# Patient Record
Sex: Male | Born: 1975 | Race: Black or African American | Hispanic: No | Smoking: Current some day smoker
Health system: Southern US, Community
[De-identification: ages and names within clinical notes are randomized; demographics above are authoritative.]

## PROBLEM LIST (undated history)

## (undated) DIAGNOSIS — Z789 Other specified health status: Secondary | ICD-10-CM

## (undated) HISTORY — DX: Other specified health status: Z78.9

## (undated) HISTORY — PX: OTHER SURGICAL HISTORY: SHX169

---

## 2005-11-05 ENCOUNTER — Emergency Department: Payer: Self-pay | Admitting: Emergency Medicine

## 2016-11-14 ENCOUNTER — Emergency Department
Admission: EM | Admit: 2016-11-14 | Discharge: 2016-11-14 | Disposition: A | Discharge status: Home / Self Care | Payer: Self-pay | Attending: Emergency Medicine | Admitting: Emergency Medicine

## 2016-11-14 ENCOUNTER — Encounter: Payer: Self-pay | Admitting: Emergency Medicine

## 2016-11-14 ENCOUNTER — Emergency Department: Payer: Self-pay

## 2016-11-14 DIAGNOSIS — R361 Hematospermia: Secondary | ICD-10-CM

## 2016-11-14 DIAGNOSIS — N3001 Acute cystitis with hematuria: Secondary | ICD-10-CM

## 2016-11-14 DIAGNOSIS — N5089 Other specified disorders of the male genital organs: Secondary | ICD-10-CM

## 2016-11-14 DIAGNOSIS — N50812 Left testicular pain: Secondary | ICD-10-CM

## 2016-11-14 DIAGNOSIS — F17200 Nicotine dependence, unspecified, uncomplicated: Secondary | ICD-10-CM | POA: Insufficient documentation

## 2016-11-14 DIAGNOSIS — N503 Cyst of epididymis: Secondary | ICD-10-CM

## 2016-11-14 LAB — URINALYSIS, COMPLETE (UACMP) WITH MICROSCOPIC
BILIRUBIN URINE: NEGATIVE
Glucose, UA: NEGATIVE mg/dL
KETONES UR: 20 mg/dL — AB
Nitrite: NEGATIVE
PROTEIN: 30 mg/dL — AB
SQUAMOUS EPITHELIAL / LPF: NONE SEEN
Specific Gravity, Urine: 1.023 (ref 1.005–1.030)
pH: 6 (ref 5.0–8.0)

## 2016-11-14 MED ORDER — ONDANSETRON 4 MG PO TBDP
4.0000 mg | ORAL_TABLET | Freq: Once | ORAL | Status: AC
  Administered 2016-11-14: 4 mg via ORAL
  Filled 2016-11-14: qty 1

## 2016-11-14 MED ORDER — ACETAMINOPHEN 325 MG PO TABS
650.0000 mg | ORAL_TABLET | Freq: Once | ORAL | Status: AC
  Administered 2016-11-14: 650 mg via ORAL
  Filled 2016-11-14: qty 2

## 2016-11-14 MED ORDER — SULFAMETHOXAZOLE-TRIMETHOPRIM 800-160 MG PO TABS: 1.0000 | ORAL_TABLET | Freq: Two times a day (BID) | ORAL | 0 refills | Status: DC

## 2016-11-14 MED ORDER — IBUPROFEN 800 MG PO TABS: 800.0000 mg | ORAL_TABLET | Freq: Three times a day (TID) | ORAL | 0 refills | Status: DC | PRN

## 2016-11-14 MED ORDER — IBUPROFEN 800 MG PO TABS
800.0000 mg | ORAL_TABLET | Freq: Once | ORAL | Status: AC
  Administered 2016-11-14: 800 mg via ORAL
  Filled 2016-11-14: qty 1

## 2016-11-14 NOTE — ED Notes (Signed)
AAOx3.  Skin warm and dry.  NAD 

## 2016-11-14 NOTE — ED Notes (Signed)
Pt unable to sit still in triage due to pain
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
## 2016-11-14 NOTE — Discharge Instructions (Addendum)
You may apply ice to your testicle for 15 minutes every 2 hours to decrease pain. You may also take Motrin or Tylenol.  If you continue to have discomfort, you may see the outpatient urologist, Dr. Apolinar JunesBrandon.  Today you have an infection in your urine; please take the entire course of antibiotics and have your doctor follow-up your urine culture.  Return to the emergency department if you develop severe pain, swelling, redness, fever, or any other symptoms concerning to you.
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
## 2016-11-14 NOTE — ED Notes (Signed)
Patient transported to Ultrasound 

## 2016-11-17 LAB — URINE CULTURE: Culture: >=100000 — AB

## 2018-07-16 ENCOUNTER — Other Ambulatory Visit: Payer: Self-pay

## 2018-07-16 ENCOUNTER — Encounter: Payer: Self-pay | Admitting: Urology

## 2018-07-16 ENCOUNTER — Ambulatory Visit (INDEPENDENT_AMBULATORY_CARE_PROVIDER_SITE_OTHER): Payer: No Typology Code available for payment source | Admitting: Urology

## 2018-07-16 VITALS — BP 152/81 | HR 96 | Ht 72.0 in | Wt 234.0 lb

## 2018-07-16 DIAGNOSIS — N50819 Testicular pain, unspecified: Secondary | ICD-10-CM

## 2018-07-16 DIAGNOSIS — N390 Urinary tract infection, site not specified: Secondary | ICD-10-CM | POA: Diagnosis not present

## 2018-07-16 DIAGNOSIS — N453 Epididymo-orchitis: Secondary | ICD-10-CM

## 2018-07-16 LAB — BLADDER SCAN AMB NON-IMAGING

## 2018-07-16 NOTE — Progress Notes (Signed)
07/16/2018 8:27 PM   Jason Yang Jun 07, 1975 [id number]  Referring provider: Cyndie Chime, MD [address] [place],  [place] [address]  Chief Complaint  Patient presents with  . Epididymo-orchitis    New Patient    HPI: 43 year old male who presents today to establish care for history of epididymitis.  He was seen and evaluated by his primary care physician on 06/09/2018 with onset of right testicular pain and swelling.  He was treated with antibiotics, Levaquin for 10 days.  No UA or labs were performed.  No imaging.  He was seen a few days later in follow-up and was improving.  He was referred to urology for further evaluation.  He does have a personal history of epididymitis in 2018 as well.  On this occasion, his pain is on the left side.  His UA at the time was suspicious for UTI and his urine culture ultimately grew E. coli.  He responded well to Bactrim.  He is sexually active with women.  He does not always use condoms.  He denies any urethral discharge.  He is not aware of any STI exposures.  In general when he is not symptomatic with epididymoorchitis, he does feel like he empties his bladder fairly well.  He occasionally has some post void dribbling, otherwise no urgency or frequency, dysuria or gross hematuria.  He denies any voiding symptoms today.  He wants to know why he keeps getting these testicular infections.  He does note that he often notices changes in his urination including color as well as odor when he is been drinking too much the hall.   PMH: Past Medical History:  Diagnosis Date  . Known health problems: none     Surgical History: Past Surgical History:  Procedure Laterality Date  . none      Home Medications:  Allergies as of 07/16/2018   No Known Allergies     Medication List       Accurate as of July 16, 2018 11:59 PM. If you have any questions, ask your nurse or doctor.        STOP taking these medications    ibuprofen 800 MG tablet Commonly known as: ADVIL Stopped by: Hollice Espy, MD   sulfamethoxazole-trimethoprim 800-160 MG tablet Commonly known as: BACTRIM DS Stopped by: Hollice Espy, MD       Allergies: No Known Allergies  Family History: Family History  Problem Relation Age of Onset  . Bladder Cancer Neg Hx   . Prostate cancer Neg Hx   . Kidney cancer Neg Hx     Social History:  reports that he has been smoking. He has never used smokeless tobacco. He reports current alcohol use. No history on file for drug.  ROS: UROLOGY Frequent Urination?: No Hard to postpone urination?: No Burning/pain with urination?: No Get up at night to urinate?: No Leakage of urine?: No Urine stream starts and stops?: No Trouble starting stream?: No Do you have to strain to urinate?: No Blood in urine?: No Urinary tract infection?: No Sexually transmitted disease?: No Injury to kidneys or bladder?: No Painful intercourse?: No Weak stream?: No Erection problems?: No Penile pain?: No  Gastrointestinal Nausea?: No Vomiting?: No Indigestion/heartburn?: No Diarrhea?: No Constipation?: No  Constitutional Fever: No Night sweats?: No Weight loss?: No Fatigue?: No  Skin Skin rash/lesions?: No Itching?: No  Eyes Blurred vision?: No Double vision?: No  Ears/Nose/Throat Sore throat?: No Sinus problems?: No  Hematologic/Lymphatic Swollen glands?: No Easy bruising?: No  Cardiovascular Leg swelling?: No Chest pain?: No  Respiratory Cough?: No Shortness of breath?: No  Endocrine Excessive thirst?: No  Musculoskeletal Back pain?: No Joint pain?: No  Neurological Headaches?: No Dizziness?: No  Psychologic Depression?: No Anxiety?: No  Physical Exam: BP (!) 152/81   Pulse 96   Ht 6' (1.829 m)   Wt 234 lb (106.1 kg)   BMI 31.74 kg/m   Constitutional:  Alert and oriented, No acute distress. HEENT: [place] AT, moist mucus membranes.  Trachea midline, no  masses. Cardiovascular: No clubbing, cyanosis, or edema. Respiratory: Normal respiratory effort, no increased work of breathing. GI: Abdomen is soft, nontender, nondistended, no abdominal masses GU: No CVA tenderness.  Urged, minimally tender right testicle without a discrete mass but grossly abnormal.  No scrotal skin changes.  Left testicle is normal, nontender, no no masses.  Circumcised phallus with no urethral discharge, orthotopic meatus. Lymph: Noinguinal lymphadenopathy. Skin: No rashes, bruises or suspicious lesions. Neurologic: Grossly intact, no focal deficits, moving all 4 extremities. Psychiatric: Normal mood and affect.  Laboratory Data: NA  Urinalysis Results for orders placed or performed in visit on 07/16/18  Urine culture   Specimen: Urine   UR  Result Value Ref Range   Urine Culture, Routine Final report (A)    Organism ID, Bacteria Comment (A)    Antimicrobial Susceptibility Comment   GC/Chlamydia Probe Amp([facility])   Specimen: Urine   UR  Result Value Ref Range   Chlamydia trachomatis, NAA Negative Negative   Neisseria Gonorrhoeae by PCR Negative Negative  Microscopic Examination   URINE  Result Value Ref Range   WBC, UA 11-30 (A) 0 - 5 /hpf   RBC None seen 0 - 2 /hpf   Epithelial Cells (non renal) None seen 0 - 10 /hpf   Bacteria, UA Moderate (A) None seen/Few  Urinalysis, Complete  Result Value Ref Range   Specific Gravity, UA 1.015 1.005 - 1.030   pH, UA 6.5 5.0 - 7.5   Color, UA Yellow Yellow   Appearance Ur Clear Clear   Leukocytes,UA 2+ (A) Negative   Protein,UA Negative Negative/Trace   Glucose, UA Negative Negative   Ketones, UA Negative Negative   RBC, UA Negative Negative   Bilirubin, UA Negative Negative   Urobilinogen, Ur 1.0 0.2 - 1.0 mg/dL   Nitrite, UA Negative Negative   Microscopic Examination See below:   Bladder Scan (Post Void Residual) in office  Result Value Ref Range   Scan Result 45ml      Pertinent Imaging:    Assessment & Plan:    1. Testicle pain Likely secondary to recurrent episode of epididymoorchitis, treated in the emergency room with a round of antibiotics with some improvement but without complete resolution.  UA today is highly suspicious for ongoing infection.  We will send off UA/urine culture and go ahead and treat with Bactrim DS twice daily for presumed UTI.  We also discussed that given his age group and history of unprotected intercourse, would strongly recommend ruling out sexually transmitted infections which she is agreeable today.  We will send off chlamydia and gonorrhea.  We discussed today that it is fairly unusual to have recurrent episodes of urinary tract infections in a male.  We discussed that if he continues to have these infections, would strongly recommend additional upper tract imaging.  He is agreeable this plan.  Adequate bladder emptying.  - Urinalysis, Complete - Urine culture - US SCROTUM; Future - US PELVIC DOPPLER (TORSION R/O OR MASS  ARTERIAL FLOW); Future - GC/Chlamydia Probe Amp([facility]) - Bladder Scan (Post Void Residual) in office  2. Orchitis and epididymitis , Right testicular exam, presumably related to infection but cannot rule out underlying mass.  Have recommended follow-up scrotal ultrasound which he is agreeable.  I will call him with these results.  3. Recurrent UTI As above   Vanna ScotlandAshley Suleyman Ehrman, MD  [facility] [address], [address] [place], [place] [address] [phone]

## 2018-07-17 LAB — URINALYSIS, COMPLETE
Bilirubin, UA: NEGATIVE
Glucose, UA: NEGATIVE
Ketones, UA: NEGATIVE
Nitrite, UA: NEGATIVE
Protein,UA: NEGATIVE
RBC, UA: NEGATIVE
Specific Gravity, UA: 1.015 (ref 1.005–1.030)
Urobilinogen, Ur: 1 mg/dL (ref 0.2–1.0)
pH, UA: 6.5 (ref 5.0–7.5)

## 2018-07-17 LAB — MICROSCOPIC EXAMINATION
Epithelial Cells (non renal): NONE SEEN /hpf (ref 0–10)
RBC: NONE SEEN /hpf (ref 0–2)

## 2018-07-19 LAB — URINE CULTURE

## 2018-07-19 LAB — GC/CHLAMYDIA PROBE AMP
Chlamydia trachomatis, NAA: NEGATIVE
Neisseria Gonorrhoeae by PCR: NEGATIVE

## 2018-07-22 ENCOUNTER — Telehealth: Payer: Self-pay | Admitting: *Deleted

## 2018-07-22 ENCOUNTER — Telehealth: Payer: Self-pay | Admitting: Urology

## 2018-07-22 NOTE — Telephone Encounter (Signed)
Pt returned missed call, please call him back at [phone].

## 2018-07-22 NOTE — Telephone Encounter (Signed)
Spoke with patient see telephone encounter.

## 2018-07-22 NOTE — Telephone Encounter (Addendum)
Patient informed-verbalized understanding.  He will call to cancel appointment for ultrasound. Follow up appointment scheduled in one month.   ----- Message from Hollice Espy, MD sent at 07/22/2018  8:10 AM EDT ----- Chlamydia and gonorrhea are negative.  Urine culture ended up growing E. coli which is sensitive to the antibiotic, Bactrim which was prescribed.  Given that you continue to have an active infection, I think we can hold off on the scrotal ultrasound at this time.  If the swelling does not resolve, we can always get the ultrasound later on.  I like to see you again in a month to reassess with repeat exam.  Please schedule.  Hollice Espy, MD

## 2018-07-25 ENCOUNTER — Ambulatory Visit: Admission: RE | Admit: 2018-07-25 | Payer: No Typology Code available for payment source | Source: Ambulatory Visit

## 2018-08-06 ENCOUNTER — Ambulatory Visit: Payer: Self-pay | Admitting: Urology

## 2018-08-20 ENCOUNTER — Ambulatory Visit: Payer: Self-pay | Admitting: Urology

## 2018-10-04 IMAGING — US US SCROTUM W/ DOPPLER COMPLETE
1 series · 13 of 25 positions shown · non-contrast
Comparison: None.

CLINICAL DATA: Left-sided scrotal pain and swelling for 2 days.
Recent trauma.

EXAM:
SCROTAL ULTRASOUND
DOPPLER ULTRASOUND OF THE TESTICLES
TECHNIQUE: Complete ultrasound examination of the testicles, epididymis, and
other scrotal structures was performed. Color and spectral Doppler
ultrasound were also utilized to evaluate blood flow to the
testicles.

[Series 1: us scrotum w/ doppler complete · 0.08mm/px · 13 of 101 slices shown]
[im 1/101]
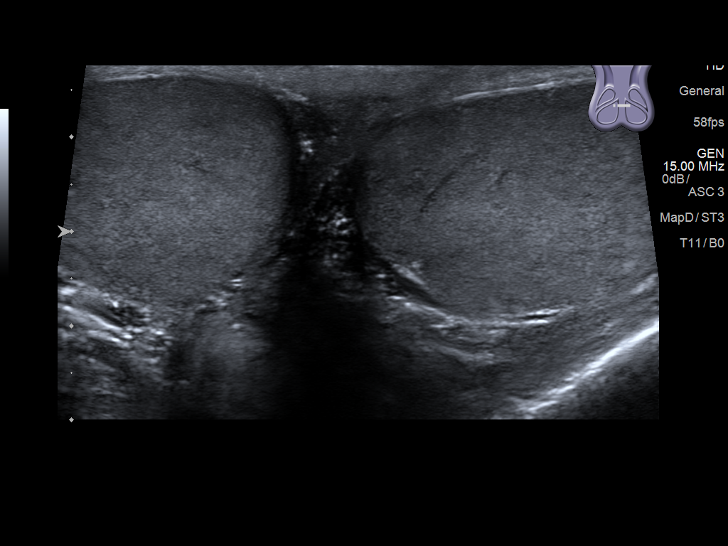
[im 9/101]
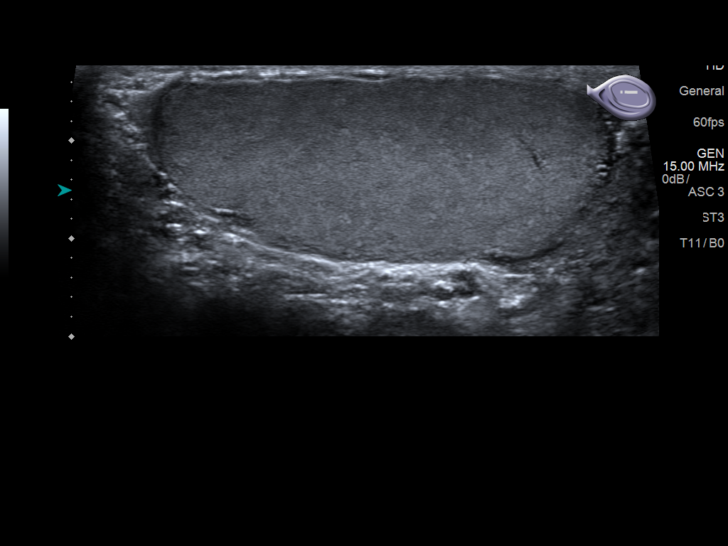
[im 17/101]
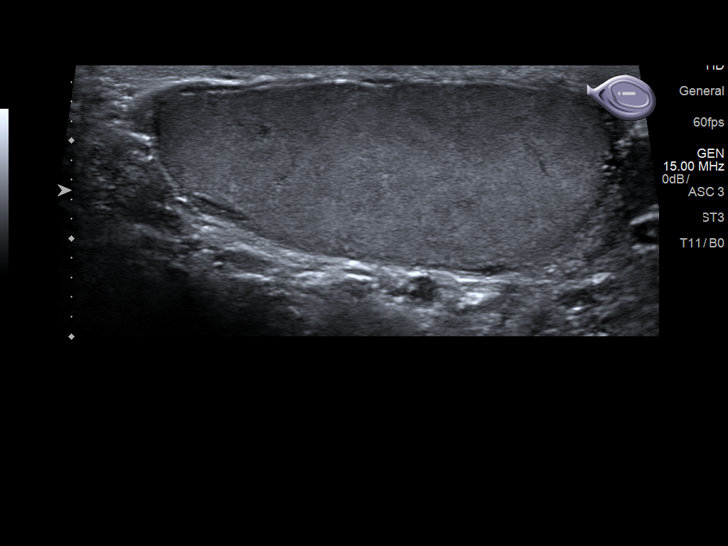
[im 26/101]
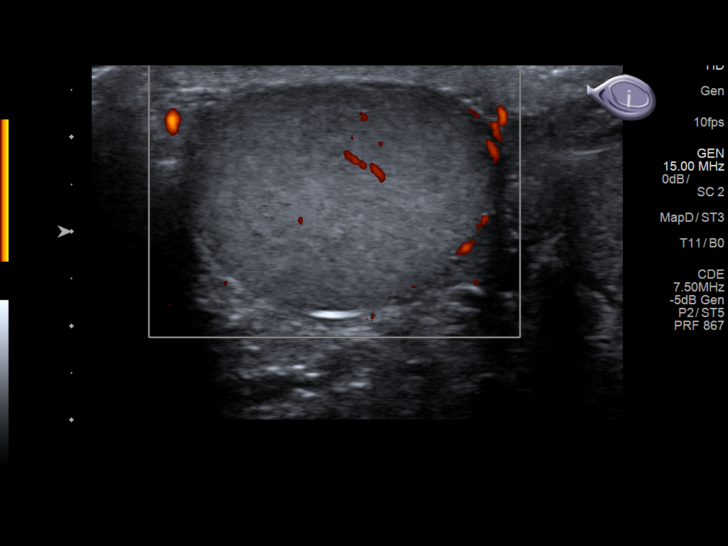
[im 34/101]
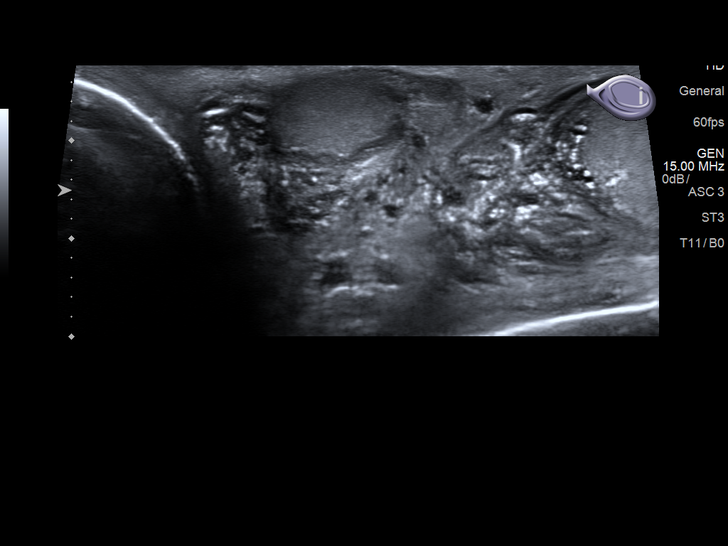
[im 42/101]
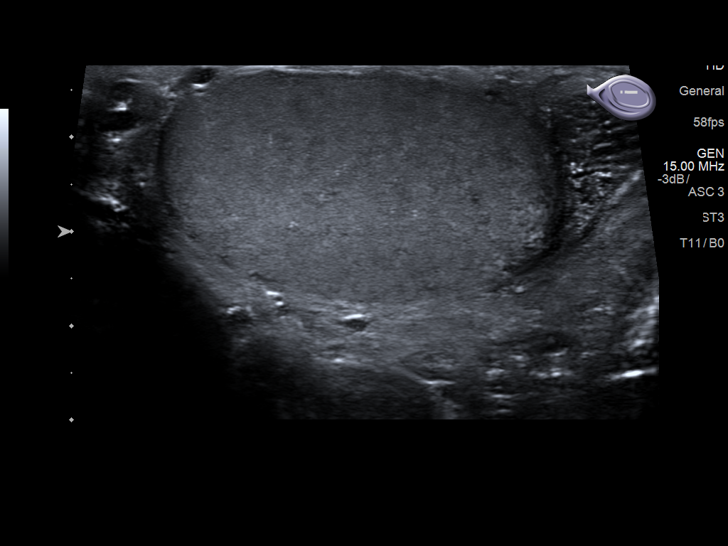
[im 51/101]
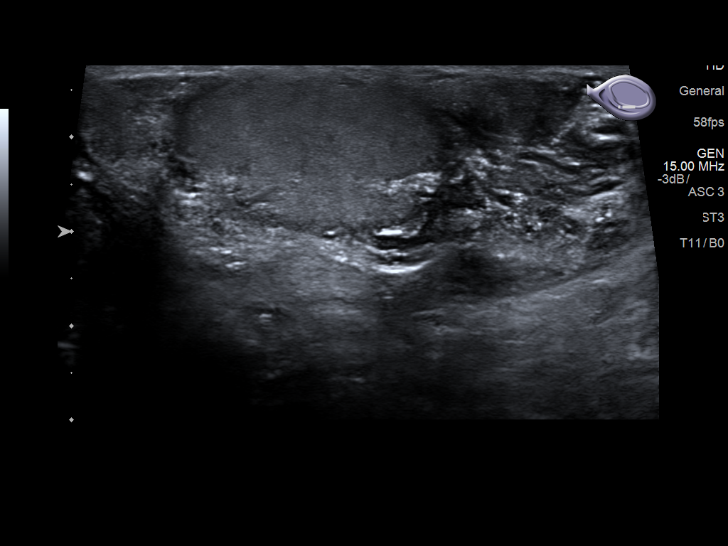
[im 59/101]
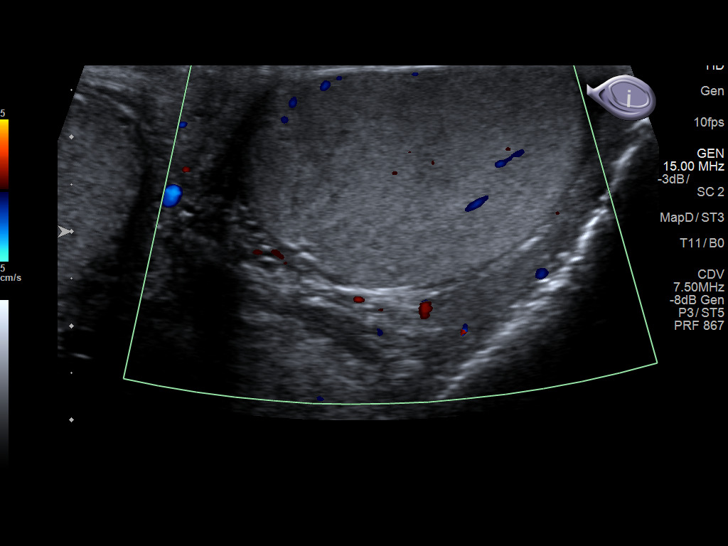
[im 67/101]
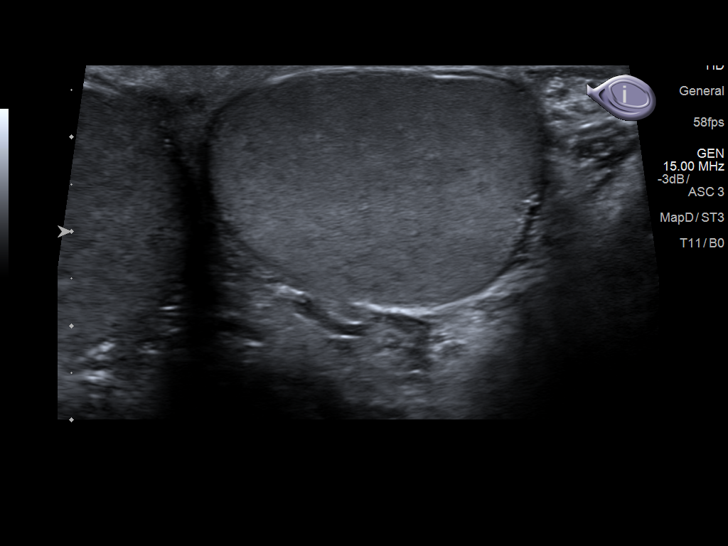
[im 76/101]
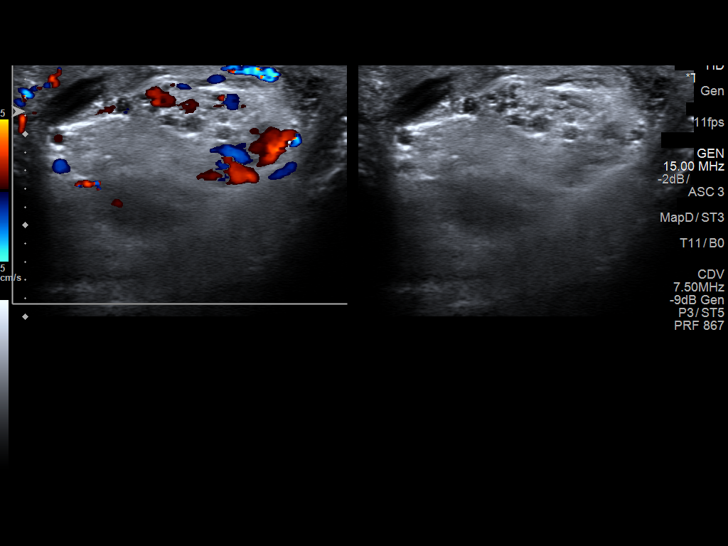
[im 84/101]
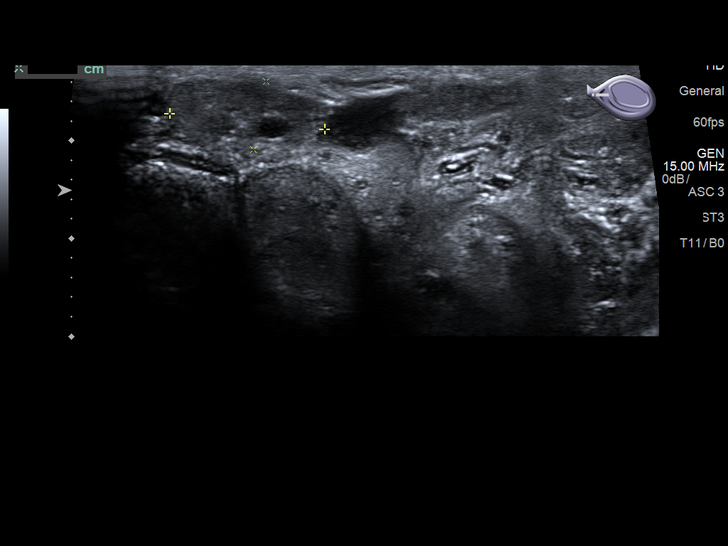
[im 92/101]
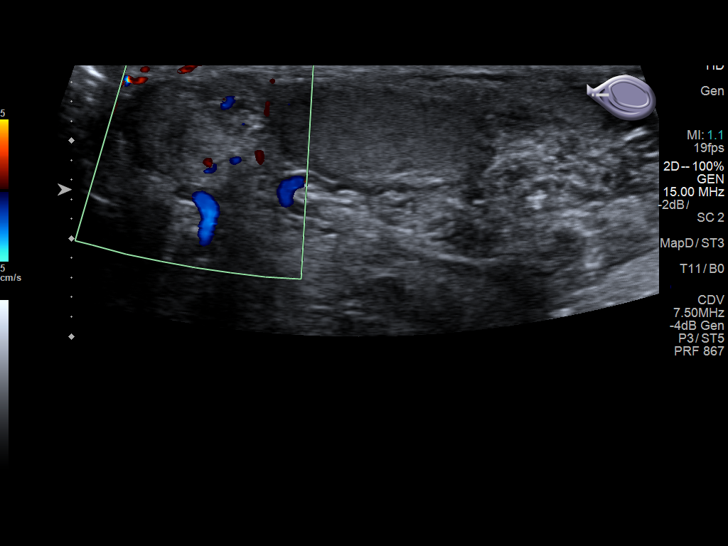
[im 101/101]
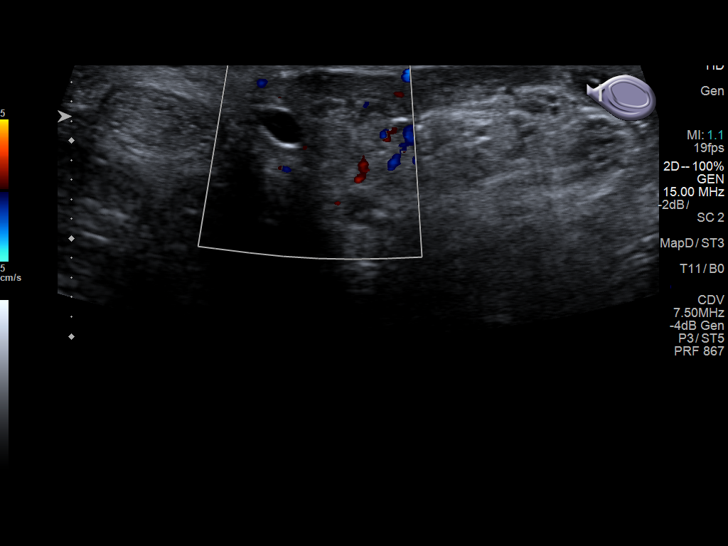

[13 of 25 positions shown; findings below may reference images not displayed]

FINDINGS: Right testicle

Measurements: 4.6 x 2.0 x 3.1 cm. No mass or microlithiasis
visualized.

Left testicle

Measurements: 4.4 x 2.5 x 3.3 cm. No mass or microlithiasis
visualized.

Right epididymis: There is a cyst in the head of the epididymis on
the right measuring 3 x 4 x 4 mm. No inflammatory focus or
hyperemia.

Left epididymis: There is a cyst in the head of the epididymis
measuring 2 x 3 x 3 mm with a second cyst measuring 4 x 3 x 5 mm in
this area. No inflammatory focus or hyperemia.

Hydrocele:  None visualized.

Varicocele:  None visualized.

Pulsed Doppler interrogation of both testes demonstrates normal low
resistance arterial and venous waveforms bilaterally.

No scrotal abscess or scrotal wall thickening on either side.
IMPRESSION: Small epididymal head cysts. Study otherwise unremarkable. In
particular, no evidence of intratesticular mass on either side. No
traumatic appearing lesion. No testicular torsion on either side. No
orchitis or epididymitis on either side. No appreciable hydroceles
on either side.

## 2019-09-04 ENCOUNTER — Emergency Department
Admission: EM | Admit: 2019-09-04 | Discharge: 2019-09-05 | Disposition: A | Discharge status: Home / Self Care | Payer: 59 | Attending: Emergency Medicine | Admitting: Emergency Medicine

## 2019-09-04 ENCOUNTER — Other Ambulatory Visit: Payer: Self-pay

## 2019-09-04 ENCOUNTER — Telehealth: Payer: Self-pay | Admitting: Nurse Practitioner

## 2019-09-04 ENCOUNTER — Encounter: Payer: Self-pay | Admitting: Emergency Medicine

## 2019-09-04 DIAGNOSIS — R202 Paresthesia of skin: Secondary | ICD-10-CM | POA: Insufficient documentation

## 2019-09-04 DIAGNOSIS — R42 Dizziness and giddiness: Secondary | ICD-10-CM | POA: Diagnosis not present

## 2019-09-04 DIAGNOSIS — Z5321 Procedure and treatment not carried out due to patient leaving prior to being seen by health care provider: Secondary | ICD-10-CM | POA: Insufficient documentation

## 2019-09-04 DIAGNOSIS — R399 Unspecified symptoms and signs involving the genitourinary system: Secondary | ICD-10-CM

## 2019-09-04 LAB — URINALYSIS, COMPLETE (UACMP) WITH MICROSCOPIC
Bacteria, UA: NONE SEEN
Bilirubin Urine: NEGATIVE
Glucose, UA: NEGATIVE mg/dL
Hgb urine dipstick: NEGATIVE
Ketones, ur: NEGATIVE mg/dL
Nitrite: NEGATIVE
Protein, ur: NEGATIVE mg/dL
Specific Gravity, Urine: 1.017 (ref 1.005–1.030)
pH: 5 (ref 5.0–8.0)

## 2019-09-04 LAB — COMPREHENSIVE METABOLIC PANEL
ALT: 45 U/L — ABNORMAL HIGH (ref 0–44)
AST: 42 U/L — ABNORMAL HIGH (ref 15–41)
Albumin: 4.3 g/dL (ref 3.5–5.0)
Alkaline Phosphatase: 77 U/L (ref 38–126)
Anion gap: 11 (ref 5–15)
BUN: 16 mg/dL (ref 6–20)
CO2: 23 mmol/L (ref 22–32)
Calcium: 9 mg/dL (ref 8.9–10.3)
Chloride: 101 mmol/L (ref 98–111)
Creatinine, Ser: 1.21 mg/dL (ref 0.61–1.24)
GFR calc Af Amer: >60 mL/min (ref >60)
GFR calc non Af Amer: >60 mL/min (ref >60)
Glucose, Bld: 123 mg/dL — ABNORMAL HIGH (ref 70–99)
Potassium: 3.8 mmol/L (ref 3.5–5.1)
Sodium: 135 mmol/L (ref 135–145)
Total Bilirubin: 1.1 mg/dL (ref 0.3–1.2)
Total Protein: 7.8 g/dL (ref 6.5–8.1)

## 2019-09-04 LAB — CBC
HCT: 45.3 % (ref 39.0–52.0)
Hemoglobin: 15.4 g/dL (ref 13.0–17.0)
MCH: 28.7 pg (ref 26.0–34.0)
MCHC: 34 g/dL (ref 30.0–36.0)
MCV: 84.5 fL (ref 80.0–100.0)
Platelets: 210 10*3/uL (ref 150–400)
RBC: 5.36 MIL/uL (ref 4.22–5.81)
RDW: 14.1 % (ref 11.5–15.5)
WBC: 3.6 10*3/uL — ABNORMAL LOW (ref 4.0–10.5)
nRBC: 0 % (ref 0.0–0.2)

## 2019-09-04 MED ORDER — CEPHALEXIN 500 MG PO CAPS: 500.0000 mg | ORAL_CAPSULE | Freq: Two times a day (BID) | ORAL | 0 refills | Status: DC

## 2019-09-04 NOTE — ED Triage Notes (Signed)
Here for intermittent dizzy spells for a day or two. Had tingling sensation in left arm and leg last night that resolved.  Drinks two 24 oz beers per day.  Unlabored. VSS.  ambulatory with steady gait.

## 2019-09-04 NOTE — Progress Notes (Signed)
We are sorry that you are not feeling well.  Here is how we plan to help!  Based on what you shared with me it looks like you most likely have a simple urinary tract infection.  A UTI (Urinary Tract Infection) is a bacterial infection of the bladder.  Most cases of urinary tract infections are simple to treat but a key part of your care is to encourage you to drink plenty of fluids and watch your symptoms carefully.  I have prescribed Keflex 500 mg twice a day for 10 days.  Your symptoms should gradually improve. Call us if the burning in your urine worsens, you develop worsening fever, back pain or pelvic pain or if your symptoms do not resolve after completing the antibiotic.  Urinary tract infections can be prevented by drinking plenty of water to keep your body hydrated.  Also be sure when you wipe, wipe from front to back and don't hold it in!  If possible, empty your bladder every 4 hours.  Your e-visit answers were reviewed by a board certified advanced clinical practitioner to complete your personal care plan.  Depending on the condition, your plan could have included both over the counter or prescription medications.  If there is a problem please reply  once you have received a response from your provider.  Your safety is important to [place].  If you have drug allergies check your prescription carefully.    You can use MyChart to ask questions about today's visit, request a non-urgent call back, or ask for a work or school excuse for 24 hours related to this e-Visit. If it has been greater than 24 hours you will need to follow up with your provider, or enter a new e-Visit to address those concerns.   You will get an e-mail in the next two days asking about your experience.  I hope that your e-visit has been valuable and will speed your recovery. Thank you for using e-visits.  5-10 minutes spent reviewing and documenting in chart.

## 2019-09-04 NOTE — ED Notes (Signed)
Pt states he will get his labs off his my chart and see his MD.  Pt told to return if sx change or worsen.  Pt agreeable.

## 2022-01-07 ENCOUNTER — Ambulatory Visit
Admission: EM | Admit: 2022-01-07 | Discharge: 2022-01-07 | Disposition: A | Discharge status: Home / Self Care | Payer: BLUE CROSS/BLUE SHIELD | Attending: Urgent Care | Admitting: Urgent Care

## 2022-01-07 DIAGNOSIS — B9689 Other specified bacterial agents as the cause of diseases classified elsewhere: Secondary | ICD-10-CM | POA: Diagnosis not present

## 2022-01-07 DIAGNOSIS — J019 Acute sinusitis, unspecified: Secondary | ICD-10-CM

## 2022-01-07 MED ORDER — AMOXICILLIN-POT CLAVULANATE 875-125 MG PO TABS: 1.0000 | ORAL_TABLET | Freq: Two times a day (BID) | ORAL | 0 refills | Status: AC

## 2022-01-07 NOTE — ED Triage Notes (Signed)
Pt. Presents to UC w/ right ear pain for the past 2 weeks.

## 2022-01-07 NOTE — ED Provider Notes (Signed)
Jason Yang    CSN: [id number] Arrival date & time: 01/07/22  1507      History   Chief Complaint Chief Complaint  Patient presents with   Otalgia    HPI Jason Yang is a 46 y.o. male.    Otalgia   Presents to urgent care with complaint of right ear pain x 2 weeks.  He states symptoms acutely worsened in the last few days.  He also endorses sinus pressure and pain in his face x 2 weeks.  He states he had a "cold" and got better and then his son came over who was sick and he thinks he got him sick again.  Past Medical History:  Diagnosis Date   Known health problems: none     There are no problems to display for this patient.   Past Surgical History:  Procedure Laterality Date   none         Home Medications    Prior to Admission medications   Medication Sig Start Date End Date Taking? Authorizing Provider  cephALEXin (KEFLEX) 500 MG capsule Take 1 capsule (500 mg total) by mouth 2 (two) times daily. 09/04/19   Jason Pierini, FNP    Family History Family History  Problem Relation Age of Onset   Bladder Cancer Neg Hx    Prostate cancer Neg Hx    Kidney cancer Neg Hx     Social History Social History   Tobacco Use   Smoking status: Some Days   Smokeless tobacco: Never  Substance Use Topics   Alcohol use: Yes     Allergies   Patient has no known allergies.   Review of Systems Review of Systems  HENT:  Positive for ear pain.      Physical Exam Triage Vital Signs ED Triage Vitals [01/07/22 1533]  Enc Vitals Group     BP 126/88     Pulse Rate 87     Resp 18     Temp 99.1 F (37.3 C)     Temp Source Oral     SpO2 97 %     Weight      Height      Head Circumference      Peak Flow      Pain Score 0     Pain Loc      Pain Edu?      Excl. in GC?    No data found.  Updated Vital Signs BP 126/88 (BP Location: Left Arm)   Pulse 87   Temp 99.1 F (37.3 C) (Oral)   Resp 18   SpO2 97%   Visual Acuity Right  Eye Distance:   Left Eye Distance:   Bilateral Distance:    Right Eye Near:   Left Eye Near:    Bilateral Near:     Physical Exam Vitals reviewed.  Constitutional:      Appearance: Normal appearance.  HENT:     Ears:     Comments: Bilateral EACs are erythematous, right greater than left..  TM are WNL bilaterally..    Nose:     Right Sinus: Maxillary sinus tenderness present.     Left Sinus: Maxillary sinus tenderness present.  Skin:    General: Skin is warm and dry.  Neurological:     General: No focal deficit present.     Mental Status: He is alert and oriented to person, place, and time.  Psychiatric:        Mood and Affect: Mood  normal.        Behavior: Behavior normal.      UC Treatments / Results  Labs (all labs ordered are listed, but only abnormal results are displayed) Labs Reviewed - No data to display  EKG   Radiology No results found.  Procedures Procedures (including critical care time)  Medications Ordered in UC Medications - No data to display  Initial Impression / Assessment and Plan / UC Course  I have reviewed the triage vital signs and the nursing notes.  Pertinent labs & imaging results that were available during my care of the patient were reviewed by me and considered in my medical decision making (see chart for details).   Patient is afebrile here without recent antipyretics. Satting well on room air. Overall is well appearing, well hydrated, without respiratory distress. Pulmonary exam is unremarkable.  EACs are erythematous bilaterally, right greater than left.  Patient has maxillary sinus tenderness with palpation.  Suspect acute bacterial sinusitis secondary to past viral sinusitis.  It follows that this is causing pressure to build in his ears which is causing discomfort.  Will treat with an antibiotic and continue to recommend patient use OTC medication for symptom control including nasal decongestion.  Final Clinical Impressions(s)  / UC Diagnoses   Final diagnoses:  None   Discharge Instructions   None    ED Prescriptions   None    PDMP not reviewed this encounter.   Jason Yang, [place] 01/07/22 1605

## 2022-01-07 NOTE — Discharge Instructions (Addendum)
You have been diagnosed with a bacterial sinus infection secondary to a past viral upper respiratory infection based on your symptoms and exam.  I have prescribed an antibiotic to treat the bacterial infection.  Get plenty of rest and non-caffeinated fluids.  We recommend you use over-the-counter medications for symptom control including Tylenol or ibuprofen for fever, chills or body aches, and cold/cough medication.  Saline mist spray is helpful for removing excess mucus from your nose.  Room humidifiers are helpful to ease breathing at night. You might also find relief of nasal/sinus congestion symptoms by using a nasal decongestant such as Sudafed sinus (pseudoephedrine).  You will need to obtain this medication from behind the pharmacist counter.  Speak to the pharmacist to verify that you are not duplicating medications with other over-the-counter formulations that you may be using.   Follow up here or with your primary care provider if your symptoms are worsening or not improving with treatment.

## 2023-03-25 ENCOUNTER — Other Ambulatory Visit: Payer: Self-pay

## 2023-03-25 ENCOUNTER — Emergency Department
Admission: EM | Admit: 2023-03-25 | Discharge: 2023-03-25 | Disposition: A | Discharge status: Home / Self Care | Payer: BLUE CROSS/BLUE SHIELD | Attending: Emergency Medicine | Admitting: Emergency Medicine

## 2023-03-25 ENCOUNTER — Emergency Department: Payer: BLUE CROSS/BLUE SHIELD

## 2023-03-25 DIAGNOSIS — R202 Paresthesia of skin: Secondary | ICD-10-CM | POA: Insufficient documentation

## 2023-03-25 DIAGNOSIS — I10 Essential (primary) hypertension: Secondary | ICD-10-CM | POA: Diagnosis not present

## 2023-03-25 DIAGNOSIS — Z79899 Other long term (current) drug therapy: Secondary | ICD-10-CM | POA: Insufficient documentation

## 2023-03-25 DIAGNOSIS — R059 Cough, unspecified: Secondary | ICD-10-CM | POA: Insufficient documentation

## 2023-03-25 DIAGNOSIS — R0789 Other chest pain: Secondary | ICD-10-CM | POA: Diagnosis not present

## 2023-03-25 DIAGNOSIS — J029 Acute pharyngitis, unspecified: Secondary | ICD-10-CM | POA: Diagnosis not present

## 2023-03-25 DIAGNOSIS — R0602 Shortness of breath: Secondary | ICD-10-CM | POA: Insufficient documentation

## 2023-03-25 LAB — BASIC METABOLIC PANEL
Anion gap: 10 (ref 5–15)
BUN: 25 mg/dL — ABNORMAL HIGH (ref 6–20)
CO2: 23 mmol/L (ref 22–32)
Calcium: 9 mg/dL (ref 8.9–10.3)
Chloride: 103 mmol/L (ref 98–111)
Creatinine, Ser: 1.35 mg/dL — ABNORMAL HIGH (ref 0.61–1.24)
GFR, Estimated: >60 mL/min (ref >60)
Glucose, Bld: 102 mg/dL — ABNORMAL HIGH (ref 70–99)
Potassium: 3.7 mmol/L (ref 3.5–5.1)
Sodium: 136 mmol/L (ref 135–145)

## 2023-03-25 LAB — RESP PANEL BY RT-PCR (RSV, FLU A&B, COVID)  RVPGX2
Influenza A by PCR: NEGATIVE
Influenza B by PCR: NEGATIVE
Resp Syncytial Virus by PCR: NEGATIVE
SARS Coronavirus 2 by RT PCR: NEGATIVE

## 2023-03-25 LAB — TROPONIN I (HIGH SENSITIVITY)
Troponin I (High Sensitivity): 5 ng/L (ref <18)
Troponin I (High Sensitivity): 6 ng/L (ref <18)

## 2023-03-25 LAB — BRAIN NATRIURETIC PEPTIDE: B Natriuretic Peptide: 6.4 pg/mL (ref 0.0–100.0)

## 2023-03-25 LAB — CBC
HCT: 41.6 % (ref 39.0–52.0)
Hemoglobin: 13.9 g/dL (ref 13.0–17.0)
MCH: 28.8 pg (ref 26.0–34.0)
MCHC: 33.4 g/dL (ref 30.0–36.0)
MCV: 86.3 fL (ref 80.0–100.0)
Platelets: 226 10*3/uL (ref 150–400)
RBC: 4.82 MIL/uL (ref 4.22–5.81)
RDW: 13.4 % (ref 11.5–15.5)
WBC: 4.2 10*3/uL (ref 4.0–10.5)
nRBC: 0 % (ref 0.0–0.2)

## 2023-03-25 NOTE — ED Provider Notes (Addendum)
 [facility] Provider Note    Event Date/Time   First MD Initiated Contact with Patient 03/25/23 [phone]     (approximate)   History   Chest Pain and Numbness   HPI  Jason Yang is a 48 y.o. male with history of hypertension presenting with shortness of breath, cough, sore throat, chest tightness that started 11.  Woke up feeling short of breath, had some tingling on his left side that has resolved.  States he feels fine now, no chest pain or chest tightness at this time.  Denies any history of asthma or COPD.  No recent travel or surgeries, no history of CHF, no leg swelling, no history of blood clots or malignancy, not on any hormones.  He is on amlodipine for his high blood pressure.  On independent chart review he has been seen by cardiology in November for atypical chest pain.  Had an echo that was done that showed an EF of 55%.     Physical Exam   Triage Vital Signs: ED Triage Vitals [03/25/23 0432]  Encounter Vitals Group     BP (!) 153/99     Systolic BP Percentile      Diastolic BP Percentile      Pulse Rate 86     Resp 18     Temp 98.7 F (37.1 C)     Temp Source Oral     SpO2 100 %     Weight      Height      Head Circumference      Peak Flow      Pain Score      Pain Loc      Pain Education      Exclude from Growth Chart     Most recent vital signs: Vitals:   03/25/23 0432 03/25/23 0534  BP: (!) 153/99 129/89  Pulse: 86 81  Resp: 18 17  Temp: 98.7 F (37.1 C)   SpO2: 100% 97%     General: Awake, no distress.  CV:  Good peripheral perfusion.  Resp:  Normal effort.  Clear Abd:  No distention.  Soft nontender Other:  No unilateral calf swelling or tenderness, no focal weakness or numbness, no cranial nerve deficits, equal radial pulses bilaterally.   ED Results / Procedures / Treatments   Labs (all labs ordered are listed, but only abnormal results are displayed) Labs Reviewed  BASIC METABOLIC PANEL - Abnormal;  Notable for the following components:      Result Value   Glucose, Bld 102 (*)    BUN 25 (*)    Creatinine, Ser 1.35 (*)    All other components within normal limits  RESP PANEL BY RT-PCR (RSV, FLU A&B, COVID)  RVPGX2  CBC  BRAIN NATRIURETIC PEPTIDE  TROPONIN I (HIGH SENSITIVITY)  TROPONIN I (HIGH SENSITIVITY)     EKG  Sinus rhythm, rate 86, normal QRS, normal QTc, T wave flattening in 3, no ischemic ST elevation, not significant change compared to prior   RADIOLOGY Chest x-ray on my interpretation without focal consolidation   PROCEDURES:  Critical Care performed: No  Procedures   MEDICATIONS ORDERED IN ED: Medications - No data to display   IMPRESSION / MDM / ASSESSMENT AND PLAN / ED COURSE  I reviewed the triage vital signs and the nursing notes.  Differential diagnosis includes, but is not limited to, viral illness, pneumonia, COVID, influenza, RSV, ACS, considered CHF but he does not appear fluid overloaded.  Considered PE but he is low risk, PERC 0.  For the tingling, consider electrolyte derangements, no focal deficits to suggest CVA.  Will get labs, respiratory viral panel, chest x-ray.  Patient's presentation is most consistent with acute presentation with potential threat to life or bodily function.  Independent review of labs, Theodis Aguas is negative, BNP is negative, he has a mild AKI but electrolytes not severely deranged, no leukocytosis.  Respiratory viral panel is negative.  Heart score is 2.  On reassessment patient is feeling lot better, states he is just tired now.  Shared decision making done with patient and he is agreeable with the plan for discharge, will follow with primary care doctor in 2 to 3 days for reassessment and also his cardiologist as needed for his symptoms, strict return precautions given.  Will discharge.  Considered but no indication for inpatient admission at this time, he is safe for outpatient  management.  Clinical Course as of 03/25/23 0733  Mon Mar 25, 2023  0703 History of HTN with sob and chest tightness.  CP free now.  Cough and sore throat.  Low risk heart score. EKG looks reassuring.  Plan for labs and if trop neg will dc home with outpatient fu. Cardiology follow up as an outpatient.  [SM]  0732 DG Chest 2 View IMPRESSION: No evidence of acute chest disease.  Aortic atherosclerosis and uncoiling   [TT]    Clinical Course User Index [SM] Corena Herter, MD [TT] Jodie Echevaria Franchot Erichsen, MD     FINAL CLINICAL IMPRESSION(S) / ED DIAGNOSES   Final diagnoses:  Shortness of breath  Tingling  Chest tightness     Rx / DC Orders   ED Discharge Orders     None        Note:  This document was prepared using Dragon voice recognition software and may include unintentional dictation errors.    Claybon Jabs, MD 03/25/23 1610    Claybon Jabs, MD 03/25/23 [phone]

## 2023-03-25 NOTE — ED Triage Notes (Signed)
 Patient C/O chest tightness, hypertension (He states he has skipped some days of his BP meds although he took some tonight when he started to have symptoms), and left-sided numbness which he noticed between 0000 and 0100 this morning that he states has resolved.
